# Patient Record
Sex: Male | Born: 1985 | Race: White | Hispanic: No | Marital: Married | Smoking: Never smoker
Health system: Southern US, Community
[De-identification: ages and names within clinical notes are randomized; demographics above are authoritative.]

## PROBLEM LIST (undated history)

## (undated) DIAGNOSIS — E7800 Pure hypercholesterolemia, unspecified: Secondary | ICD-10-CM

## (undated) DIAGNOSIS — J329 Chronic sinusitis, unspecified: Secondary | ICD-10-CM

## (undated) HISTORY — PX: TYMPANOSTOMY TUBE PLACEMENT: SHX32

---

## 2015-05-28 ENCOUNTER — Ambulatory Visit (INDEPENDENT_AMBULATORY_CARE_PROVIDER_SITE_OTHER): Payer: 59

## 2015-05-28 DIAGNOSIS — R002 Palpitations: Secondary | ICD-10-CM

## 2015-07-15 ENCOUNTER — Other Ambulatory Visit: Payer: Self-pay | Admitting: Physician Assistant

## 2015-07-25 ENCOUNTER — Telehealth: Payer: Self-pay | Admitting: Cardiology

## 2015-07-25 NOTE — Telephone Encounter (Signed)
Received records from [facility] for appointment on 08/09/15 with Dr Hochrein.  Records given to N Hines (medical records) for Dr Hochrein's schedule on 08/09/15. lp °

## 2015-08-07 NOTE — Progress Notes (Deleted)
    Cardiology Office Note   Date:  08/07/2015   ID:  Bradley Cohen, DOB 1985/06/28, MRN [id number]  PCP:  Pcp Not In System  Cardiologist:   Rollene Rotunda, MD   No chief complaint on file.     History of Present Illness: Bradley Cohen is a 30 y.o. male who presents for ***    No past medical history on file.  No past surgical history on file.   No current outpatient prescriptions on file.   No current facility-administered medications for this visit.     Allergies:   Review of patient's allergies indicates not on file.    Social History:  The patient     Family History:  The patient's ***family history is not on file.    ROS:  Please see the history of present illness.   Otherwise, review of systems are positive for {NONE DEFAULTED:18576::"none"}.   All other systems are reviewed and negative.    PHYSICAL EXAM: VS:  There were no vitals taken for this visit. , BMI There is no height or weight on file to calculate BMI. GENERAL:  Well appearing HEENT:  Pupils equal round and reactive, fundi not visualized, oral mucosa unremarkable NECK:  No jugular venous distention, waveform within normal limits, carotid upstroke brisk and symmetric, no bruits, no thyromegaly LYMPHATICS:  No cervical, inguinal adenopathy LUNGS:  Clear to auscultation bilaterally BACK:  No CVA tenderness CHEST:  Unremarkable HEART:  PMI not displaced or sustained,S1 and S2 within normal limits, no S3, no S4, no clicks, no rubs, *** murmurs ABD:  Flat, positive bowel sounds normal in frequency in pitch, no bruits, no rebound, no guarding, no midline pulsatile mass, no hepatomegaly, no splenomegaly EXT:  2 plus pulses throughout, no edema, no cyanosis no clubbing SKIN:  No rashes no nodules NEURO:  Cranial nerves II through XII grossly intact, motor grossly intact throughout PSYCH:  Cognitively intact, oriented to person place and time    EKG:  EKG {ACTION; IS/IS PHX:[id number]} ordered today. The  ekg ordered today demonstrates ***   Recent Labs: No results found for requested labs within last 8760 hours.    Lipid Panel No results found for: CHOL, TRIG, HDL, CHOLHDL, VLDL, LDLCALC, LDLDIRECT    Wt Readings from Last 3 Encounters:  No data found for Wt      Other studies Reviewed: Additional studies/ records that were reviewed today include: ***. Review of the above records demonstrates:  Please see elsewhere in the note.  ***   ASSESSMENT AND PLAN:  ***   Current medicines are reviewed at length with the patient today.  The patient {ACTIONS; HAS/DOES NOT HAVE:19233} concerns regarding medicines.  The following changes have been made:  {PLAN; NO CHANGE:13088:s}  Labs/ tests ordered today include: *** No orders of the defined types were placed in this encounter.    Disposition:   FU with ***    Signed, Rollene Rotunda, MD  08/07/2015 11:10 AM    [facility]

## 2015-08-09 ENCOUNTER — Ambulatory Visit: Payer: 59 | Admitting: Cardiology

## 2020-08-13 ENCOUNTER — Encounter (HOSPITAL_BASED_OUTPATIENT_CLINIC_OR_DEPARTMENT_OTHER): Payer: Self-pay | Admitting: Obstetrics and Gynecology

## 2020-08-13 ENCOUNTER — Emergency Department (HOSPITAL_BASED_OUTPATIENT_CLINIC_OR_DEPARTMENT_OTHER)
Admission: EM | Admit: 2020-08-13 | Discharge: 2020-08-13 | Disposition: A | Discharge status: Home / Self Care | Payer: BC Managed Care – PPO | Attending: Emergency Medicine | Admitting: Emergency Medicine

## 2020-08-13 ENCOUNTER — Emergency Department (HOSPITAL_BASED_OUTPATIENT_CLINIC_OR_DEPARTMENT_OTHER): Payer: BC Managed Care – PPO

## 2020-08-13 ENCOUNTER — Other Ambulatory Visit (HOSPITAL_BASED_OUTPATIENT_CLINIC_OR_DEPARTMENT_OTHER): Payer: Self-pay

## 2020-08-13 ENCOUNTER — Other Ambulatory Visit: Payer: Self-pay

## 2020-08-13 DIAGNOSIS — I82442 Acute embolism and thrombosis of left tibial vein: Secondary | ICD-10-CM | POA: Insufficient documentation

## 2020-08-13 DIAGNOSIS — Z7901 Long term (current) use of anticoagulants: Secondary | ICD-10-CM | POA: Insufficient documentation

## 2020-08-13 DIAGNOSIS — M79662 Pain in left lower leg: Secondary | ICD-10-CM | POA: Diagnosis present

## 2020-08-13 HISTORY — DX: Chronic sinusitis, unspecified: J32.9

## 2020-08-13 HISTORY — DX: Pure hypercholesterolemia, unspecified: E78.00

## 2020-08-13 LAB — BASIC METABOLIC PANEL
Anion gap: 9 (ref 5–15)
BUN: 15 mg/dL (ref 6–20)
CO2: 29 mmol/L (ref 22–32)
Calcium: 9.9 mg/dL (ref 8.9–10.3)
Chloride: 102 mmol/L (ref 98–111)
Creatinine, Ser: 1.02 mg/dL (ref 0.61–1.24)
GFR, Estimated: >60 mL/min (ref >60)
Glucose, Bld: 105 mg/dL — ABNORMAL HIGH (ref 70–99)
Potassium: 4.6 mmol/L (ref 3.5–5.1)
Sodium: 140 mmol/L (ref 135–145)

## 2020-08-13 LAB — PROTIME-INR
INR: 0.9 (ref 0.8–1.2)
Prothrombin Time: 12.5 seconds (ref 11.4–15.2)

## 2020-08-13 LAB — CBC WITH DIFFERENTIAL/PLATELET
Abs Immature Granulocytes: 0.02 10*3/uL (ref 0.00–0.07)
Basophils Absolute: 0 10*3/uL (ref 0.0–0.1)
Basophils Relative: 0 %
Eosinophils Absolute: 0.1 10*3/uL (ref 0.0–0.5)
Eosinophils Relative: 1 %
HCT: 42.1 % (ref 39.0–52.0)
Hemoglobin: 13.5 g/dL (ref 13.0–17.0)
Immature Granulocytes: 0 %
Lymphocytes Relative: 18 %
Lymphs Abs: 1.4 10*3/uL (ref 0.7–4.0)
MCH: 29 pg (ref 26.0–34.0)
MCHC: 32.1 g/dL (ref 30.0–36.0)
MCV: 90.5 fL (ref 80.0–100.0)
Monocytes Absolute: 0.6 10*3/uL (ref 0.1–1.0)
Monocytes Relative: 8 %
Neutro Abs: 5.7 10*3/uL (ref 1.7–7.7)
Neutrophils Relative %: 73 %
Platelets: 239 10*3/uL (ref 150–400)
RBC: 4.65 MIL/uL (ref 4.22–5.81)
RDW: 14.3 % (ref 11.5–15.5)
WBC: 7.8 10*3/uL (ref 4.0–10.5)
nRBC: 0 % (ref 0.0–0.2)

## 2020-08-13 MED ORDER — RIVAROXABAN (XARELTO) VTE STARTER PACK (15 & 20 MG): ORAL_TABLET | ORAL | 0 refills | Status: AC

## 2020-08-13 NOTE — ED Provider Notes (Signed)
[facility] EMERGENCY DEPT Provider Note   CSN: [id number] Arrival date & time: 08/13/20  1120     History Chief Complaint  Patient presents with   Leg Pain    Bradley Cohen is a 35 y.o. male.  Bradley Cohen has no risk factors for DVT.  He did take a flight to [place], but this was approximately 1 hour.  No family history of thrombophilia.  The history is provided by the patient.  Leg Pain Location:  Leg Time since incident:  6 days Injury: no   Leg location:  L lower leg Pain details:    Quality:  Aching   Radiates to:  Does not radiate   Severity:  Moderate   Onset quality:  Sudden   Duration:  6 days   Timing:  Constant   Progression:  Unchanged Chronicity:  New Prior injury to area:  No Relieved by:  Nothing Worsened by:  Nothing Ineffective treatments:  Elevation and rest Associated symptoms: swelling   Associated symptoms: no back pain, no fever, no numbness and no tingling       Past Medical History:  Diagnosis Date   Hypercholesterolemia    Sinusitis     Patient Active Problem List   Diagnosis Date Noted   Palpitations 05/28/2015    Past Surgical History:  Procedure Laterality Date   TYMPANOSTOMY TUBE PLACEMENT Bilateral        No family history on file.  Social History   Tobacco Use   Smoking status: Never    Passive exposure: Never   Smokeless tobacco: Never  Vaping Use   Vaping Use: Never used  Substance Use Topics   Alcohol use: Yes    Comment: Social   Drug use: Never    Home Medications Prior to Admission medications   Medication Sig Start Date End Date Taking? Authorizing Provider  acetaminophen (TYLENOL) 500 MG tablet Take 1,000 mg by mouth every 6 (six) hours as needed.   Yes [provider]  famotidine (PEPCID) 20 MG tablet Take 20 mg by mouth at bedtime as needed. 07/02/20  Yes [provider]  finasteride (PROSCAR) 5 MG tablet Take 5 mg by mouth daily. 06/18/20  Yes [provider]  Multiple Vitamin (MULTIVITAMIN) tablet Take 1 tablet by mouth daily.   Yes [provider]  RIVAROXABAN Carlena Hurl) VTE STARTER PACK (15 & 20 MG) Follow package directions: Take one 15mg  tablet by mouth twice a day. On day 22, switch to one 20mg  tablet once a day. Take with food. 08/13/20  Yes , MD  rosuvastatin (CRESTOR) 10 MG tablet Take 10 mg by mouth daily. 07/12/20  Yes [provider]    Allergies    Patient has no allergy information on record.  Review of Systems   Review of Systems  Constitutional:  Negative for chills and fever.  HENT:  Negative for ear pain and sore throat.   Eyes:  Negative for pain and visual disturbance.  Respiratory:  Negative for cough and shortness of breath.   Cardiovascular:  Negative for chest pain and palpitations.  Gastrointestinal:  Negative for abdominal pain and vomiting.  Genitourinary:  Negative for dysuria and hematuria.  Musculoskeletal:  Negative for arthralgias and back pain.  Skin:  Negative for color change and rash.  Neurological:  Negative for seizures and syncope.  All other systems reviewed and are negative.  Physical Exam Updated Vital Signs BP (!) 139/93 (BP Location: Right Arm)   Pulse 72  Temp 98.7 F (37.1 C) (Oral)   Resp 16   SpO2 99%   Physical Exam Vitals and nursing note reviewed.  Constitutional:      Appearance: Normal appearance.  HENT:     Head: Normocephalic and atraumatic.  Eyes:     Conjunctiva/sclera: Conjunctivae normal.  Pulmonary:     Effort: Pulmonary effort is normal. No respiratory distress.  Musculoskeletal:        General: Swelling present. No deformity. Normal range of motion.     Cervical back: Normal range of motion.     Comments: Mild swelling and tenderness to palpation at the posterior aspect of the left calf.  No redness. The extremity is warm and well-perfused.   Skin:    General: Skin is warm and dry.  Neurological:     General: No focal deficit  present.     Mental Status: He is alert and oriented to person, place, and time. Mental status is at baseline.  Psychiatric:        Mood and Affect: Mood normal.    ED Results / Procedures / Treatments   Labs (all labs ordered are listed, but only abnormal results are displayed) Labs Reviewed  BASIC METABOLIC PANEL - Abnormal; Notable for the following components:      Result Value   Glucose, Bld 105 (*)    All other components within normal limits  CBC WITH DIFFERENTIAL/PLATELET  PROTIME-INR    EKG None  Radiology US Venous Img Lower Unilateral Left  Result Date: 08/13/2020 CLINICAL DATA:  35 year old male with leg swelling after recent flight EXAM: LEFT LOWER EXTREMITY VENOUS DOPPLER ULTRASOUND TECHNIQUE: Gray-scale sonography with graded compression, as well as color Doppler and duplex ultrasound were performed to evaluate the lower extremity deep venous systems from the level of the common femoral vein and including the common femoral, femoral, profunda femoral, popliteal and calf veins including the posterior tibial, peroneal and gastrocnemius veins when visible. The superficial great saphenous vein was also interrogated. Spectral Doppler was utilized to evaluate flow at rest and with distal augmentation maneuvers in the common femoral, femoral and popliteal veins. COMPARISON:  None. FINDINGS: Contralateral Common Femoral Vein: Respiratory phasicity is normal and symmetric with the symptomatic side. No evidence of thrombus. Normal compressibility. Common Femoral Vein: No evidence of thrombus. Normal compressibility, respiratory phasicity and response to augmentation. Saphenofemoral Junction: No evidence of thrombus. Normal compressibility and flow on color Doppler imaging. Profunda Femoral Vein: No evidence of thrombus. Normal compressibility and flow on color Doppler imaging. Femoral Vein: No evidence of thrombus. Normal compressibility, respiratory phasicity and response to  augmentation. Popliteal Vein: No evidence of thrombus. Normal compressibility, respiratory phasicity and response to augmentation. Calf Veins: Noncompressible posterior tibial vein and peroneal vein segments of the calf. Superficial Great Saphenous Vein: No evidence of thrombus. Normal compressibility and flow on color Doppler imaging. Other Findings:  None. IMPRESSION: Sonographic survey left lower extremity positive for calf DVT of the posterior tibial vein and peroneal vein. Negative for proximal DVT. These results were called by telephone at the time of interpretation on 08/13/2020 at 12:51 pm to Dr. Pieter Partridge, Electronically Signed   By: Gilmer Mor D.O.   On: 08/13/2020 12:52    Procedures Procedures   Medications Ordered in ED Medications - No data to display  ED Course  I have reviewed the triage vital signs and the nursing notes.  Pertinent labs & imaging results that were available during my care of the patient were reviewed by  me and considered in my medical decision making (see chart for details).    MDM Rules/Calculators/A&P                           Rodell Marrs presents with a DVT.  He will be treated with Xarelto.  He was given instructions on the treatment of the condition, and he will follow-up with his primary care doctor regarding further testing.  I also spoke with his wife.  He was also given information from up-to-date.  No indication of a PE clinically.  Renal function within normal limits. Final Clinical Impression(s) / ED Diagnoses Final diagnoses:  Acute deep vein thrombosis (DVT) of tibial vein of left lower extremity (HCC)    Rx / DC Orders ED Discharge Orders          Ordered    RIVAROXABAN (XARELTO) VTE STARTER PACK (15 & 20 MG)        08/13/20 1405             Koleen Distance, MD 08/13/20 1409

## 2020-08-13 NOTE — ED Triage Notes (Signed)
Patient reports he was recently on a flight. Patient reports left leg pain that started Thursday and his left leg is swollen and warm. Patient reports increasing pain since friday

## 2021-08-06 NOTE — Progress Notes (Unsigned)
Cardiology Office Note:   Date:  08/07/2021  NAME:  Bradley Cohen    MRN: [id number] DOB:  07/02/1985   PCP:  Lyman Bishop, DO  Cardiologist:  None  Electrophysiologist:  None   Referring MD: Lyman Bishop, DO   Chief Complaint  Patient presents with   New Patient (Initial Visit)   History of Present Illness:   Bradley Cohen is a 36 y.o. male with a hx of DVT who is being seen today for the evaluation of family history of heart disease at the request of Crissie Sickles M, DO.  He reports a very strong history of heart disease.  His father had bypass surgery.  He had several uncles also had bypass surgery.  He wishes to know what else he can do to reduce his risk of having a heart attack or stroke.  His cholesterol level 2 years ago was 112.  This was his LDL value.  This was likely acceptable.  He does not have high blood pressure.  He did have a DVT recently and is on Xarelto.  His EKG is normal.  His sister also has Ehlers-Danlos.  No vascular type.  He does need screening for this.  He has very flexible.  He is never met criteria for a diagnosis of Ehlers-Danlos.  He also reports having palpitations.  He has had skipped beats ever since he returned from [place].  He did wear a monitor 5 to 6 years ago that was unremarkable.  He continues to have symptoms.  This does not appear to be bothersome to him.  He is able to exercise.  Denies any chest pain or trouble breathing.  He reports he has been slacking a bit lately.  Normally he does a regular routine.  He is looking to get back into this.  Regardless he has no symptoms.  Cholesterol level as below.  DVT was thought to be COVID-related.  He does not smoke.  No alcohol.  No drug use.  He works as an Optometrist.  He is married with 2 children.  T chol 174, HDL 41. LDL 112, TG 116  Problem List DVT -08/2020 -L posterior tibial/peroneal vein  -covid related   Past Medical History: Past Medical History:  Diagnosis Date    Hypercholesterolemia    Sinusitis     Past Surgical History: Past Surgical History:  Procedure Laterality Date   TYMPANOSTOMY TUBE PLACEMENT Bilateral     Current Medications: Current Meds  Medication Sig   acetaminophen (TYLENOL) 500 MG tablet Take 1,000 mg by mouth every 6 (six) hours as needed.   famotidine (PEPCID) 20 MG tablet Take 20 mg by mouth at bedtime as needed.   finasteride (PROSCAR) 5 MG tablet Take 5 mg by mouth daily.   Multiple Vitamin (MULTIVITAMIN) tablet Take 1 tablet by mouth daily.   RIVAROXABAN (XARELTO) VTE STARTER PACK (15 & 20 MG) Follow package directions: Take one 80m tablet by mouth twice a day. On day 22, switch to one 273mtablet once a day. Take with food.   rosuvastatin (CRESTOR) 10 MG tablet Take 10 mg by mouth daily.     Allergies:    Patient has no known allergies.   Social History: Social History   Socioeconomic History   Marital status: Married    Spouse name: Not on file   Number of children: 2   Years of education: Not on file   Highest education level: Not on file  Occupational History  Occupation: Optometrist  Tobacco Use   Smoking status: Never    Passive exposure: Never   Smokeless tobacco: Never  Vaping Use   Vaping Use: Never used  Substance and Sexual Activity   Alcohol use: Yes    Comment: Social   Drug use: Never   Sexual activity: Yes  Other Topics Concern   Not on file  Social History Narrative   Not on file   Social Determinants of Health   Financial Resource Strain: Not on file  Food Insecurity: Not on file  Transportation Needs: Not on file  Physical Activity: Not on file  Stress: Not on file  Social Connections: Not on file     Family History: The patient's family history includes Heart disease in his brother and paternal grandfather; Heart disease (age of onset: 62) in his father; Stroke in his father.  ROS:   All other ROS reviewed and negative. Pertinent positives noted in the HPI.      EKGs/Labs/Other Studies Reviewed:   The following studies were personally reviewed by me today:  EKG:  EKG is ordered today.  The ekg ordered today demonstrates NSR 58 bpm, and was personally reviewed by me.   Recent Labs: 08/13/2020: BUN 15; Creatinine, Ser 1.02; Hemoglobin 13.5; Platelets 239; Potassium 4.6; Sodium 140   Recent Lipid Panel No results found for: "CHOL", "TRIG", "HDL", "CHOLHDL", "VLDL", "LDLCALC", "LDLDIRECT"  Physical Exam:   VS:  BP 122/84 (BP Location: Left Arm, Patient Position: Sitting, Cuff Size: Large)   Pulse (!) 58   Ht 6' 2"  (1.88 m)   Wt 237 lb (107.5 kg)   BMI 30.43 kg/m    Wt Readings from Last 3 Encounters:  08/07/21 237 lb (107.5 kg)    General: Well nourished, well developed, in no acute distress Head: Atraumatic, normal size  Eyes: PEERLA, EOMI  Neck: Supple, no JVD Endocrine: No thryomegaly Cardiac: Normal S1, S2; RRR; no murmurs, rubs, or gallops Lungs: Clear to auscultation bilaterally, no wheezing, rhonchi or rales  Abd: Soft, nontender, no hepatomegaly  Ext: No edema, pulses 2+ Musculoskeletal: No deformities, BUE and BLE strength normal and equal Skin: Warm and dry, no rashes   Neuro: Alert and oriented to person, place, time, and situation, CNII-XII grossly intact, no focal deficits  Psych: Normal mood and affect   ASSESSMENT:   Bradley Cohen is a 36 y.o. male who presents for the following: 1. Family history of heart disease   2. Palpitations   3. Mixed hyperlipidemia     PLAN:   1. Family history of heart disease -Strong family history of CAD.  Currently on Crestor.  Would recommend to continue this.  We discussed healthy lifestyle.  He should pursue regular exercise as well as improve his diet.  We will proceed with calcium scoring.  Discussed if calcium score is 0 should do this every 5 years.  He will see me back based on the results of the scan.  His sister also has Ehlers-Danlos.  She does not have a vascular type but I  believe for screening echocardiogram is warranted.  We will set him up for this.  He will see me back as needed.  2. Palpitations -Has had palpitations on and off for years.  Monitor in the past has been normal.  Appears to be stress related.  No further work-up needed.  3. Mixed hyperlipidemia -We will continue Crestor.  Strong family history is the main reason for this.      Disposition:  Return if symptoms worsen or fail to improve.  Medication Adjustments/Labs and Tests Ordered: Current medicines are reviewed at length with the patient today.  Concerns regarding medicines are outlined above.  Orders Placed This Encounter  Procedures   CT CARDIAC SCORING (SELF PAY ONLY)   EKG 12-Lead   ECHOCARDIOGRAM COMPLETE   No orders of the defined types were placed in this encounter.   Patient Instructions  Medication Instructions:  The current medical regimen is effective;  continue present plan and medications.  *If you need a refill on your cardiac medications before your next appointment, please call your pharmacy*   Testing/Procedures:  CALCIUM SCORE   Echocardiogram - Your physician has requested that you have an echocardiogram. Echocardiography is a painless test that uses sound waves to create images of your heart. It provides your doctor with information about the size and shape of your heart and how well your heart's chambers and valves are working. This procedure takes approximately one hour. There are no restrictions for this procedure.     Follow-Up: At [facility], you and your health needs are our priority.  As part of our continuing mission to provide you with exceptional heart care, we have created designated Provider Care Teams.  These Care Teams include your primary Cardiologist (physician) and Advanced Practice Providers (APPs -  Physician Assistants and Nurse Practitioners) who all work together to provide you with the care you need, when you need it.  We  recommend signing up for the patient portal called "MyChart".  Sign up information is provided on this After Visit Summary.  MyChart is used to connect with patients for Virtual Visits (Telemedicine).  Patients are able to view lab/test results, encounter notes, upcoming appointments, etc.  Non-urgent messages can be sent to your provider as well.   To learn more about what you can do with MyChart, go to [email or web address].    Your next appointment:   As needed  The format for your next appointment:   In Person  Provider:   Eleonore Chiquito, MD             Signed, Addison Naegeli. Audie Box, MD, [place]  [address], [place], [place] [address] [phone]  08/07/2021 1:14 PM

## 2021-08-07 ENCOUNTER — Ambulatory Visit (INDEPENDENT_AMBULATORY_CARE_PROVIDER_SITE_OTHER): Payer: BC Managed Care – PPO | Admitting: Cardiovascular Disease

## 2021-08-07 ENCOUNTER — Encounter: Payer: Self-pay | Admitting: Cardiovascular Disease

## 2021-08-07 VITALS — BP 122/84 | HR 58 | Ht 74.0 in | Wt 237.0 lb

## 2021-08-07 DIAGNOSIS — R002 Palpitations: Secondary | ICD-10-CM

## 2021-08-07 DIAGNOSIS — E782 Mixed hyperlipidemia: Secondary | ICD-10-CM | POA: Diagnosis not present

## 2021-08-07 DIAGNOSIS — Z8249 Family history of ischemic heart disease and other diseases of the circulatory system: Secondary | ICD-10-CM

## 2021-08-07 NOTE — Patient Instructions (Addendum)
Medication Instructions:  The current medical regimen is effective;  continue present plan and medications.  *If you need a refill on your cardiac medications before your next appointment, please call your pharmacy*   Testing/Procedures: CALCIUM SCORE   Echocardiogram - Your physician has requested that you have an echocardiogram. Echocardiography is a painless test that uses sound waves to create images of your heart. It provides your doctor with information about the size and shape of your heart and how well your heart's chambers and valves are working. This procedure takes approximately one hour. There are no restrictions for this procedure.     Follow-Up: At [facility], you and your health needs are our priority.  As part of our continuing mission to provide you with exceptional heart care, we have created designated Provider Care Teams.  These Care Teams include your primary Cardiologist (physician) and Advanced Practice Providers (APPs -  Physician Assistants and Nurse Practitioners) who all work together to provide you with the care you need, when you need it.  We recommend signing up for the patient portal called "MyChart".  Sign up information is provided on this After Visit Summary.  MyChart is used to connect with patients for Virtual Visits (Telemedicine).  Patients are able to view lab/test results, encounter notes, upcoming appointments, etc.  Non-urgent messages can be sent to your provider as well.   To learn more about what you can do with MyChart, go to [email or web address].    Your next appointment:   As needed  The format for your next appointment:   In Person  Provider:   [place] O'Neal, MD          

## 2021-08-25 ENCOUNTER — Ambulatory Visit (HOSPITAL_BASED_OUTPATIENT_CLINIC_OR_DEPARTMENT_OTHER)
Admission: RE | Admit: 2021-08-25 | Discharge: 2021-08-25 | Disposition: A | Discharge status: Home / Self Care | Payer: BC Managed Care – PPO | Source: Ambulatory Visit | Attending: Cardiovascular Disease | Admitting: Cardiovascular Disease

## 2021-08-25 ENCOUNTER — Ambulatory Visit (INDEPENDENT_AMBULATORY_CARE_PROVIDER_SITE_OTHER): Payer: BC Managed Care – PPO

## 2021-08-25 DIAGNOSIS — J90 Pleural effusion, not elsewhere classified: Secondary | ICD-10-CM | POA: Diagnosis not present

## 2021-08-25 DIAGNOSIS — I517 Cardiomegaly: Secondary | ICD-10-CM | POA: Diagnosis not present

## 2021-08-25 DIAGNOSIS — R002 Palpitations: Secondary | ICD-10-CM | POA: Insufficient documentation

## 2021-08-25 DIAGNOSIS — Z8249 Family history of ischemic heart disease and other diseases of the circulatory system: Secondary | ICD-10-CM | POA: Insufficient documentation

## 2021-08-25 DIAGNOSIS — I77810 Thoracic aortic ectasia: Secondary | ICD-10-CM | POA: Diagnosis not present

## 2021-08-25 LAB — ECHOCARDIOGRAM COMPLETE
Area-P 1/2: 2.16 cm2
S' Lateral: 3.19 cm

## 2022-08-17 IMAGING — US US EXTREM LOW VENOUS*L*
1 series · 13 of 24 positions shown · non-contrast
Comparison: None.

CLINICAL DATA: 34-year-old male with leg swelling after recent
flight



[Series 1: us venous img lower uni left (dvt) · portal-venous · 13 of 46 slices shown]
[im 1/46]
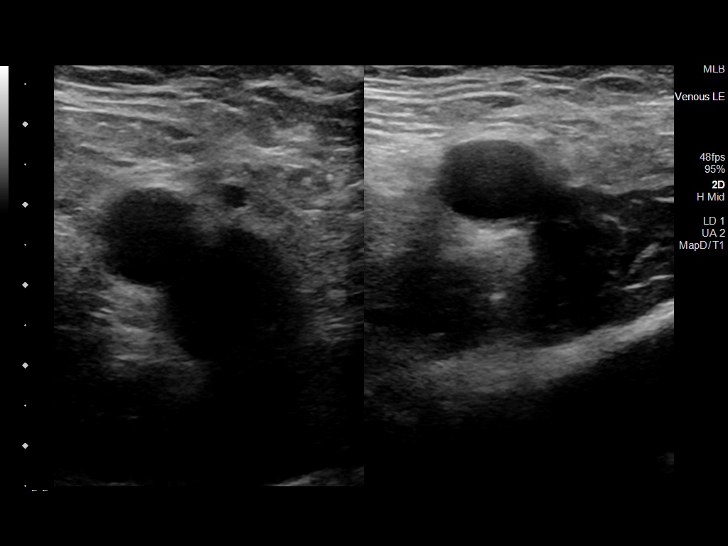
[im 4/46]
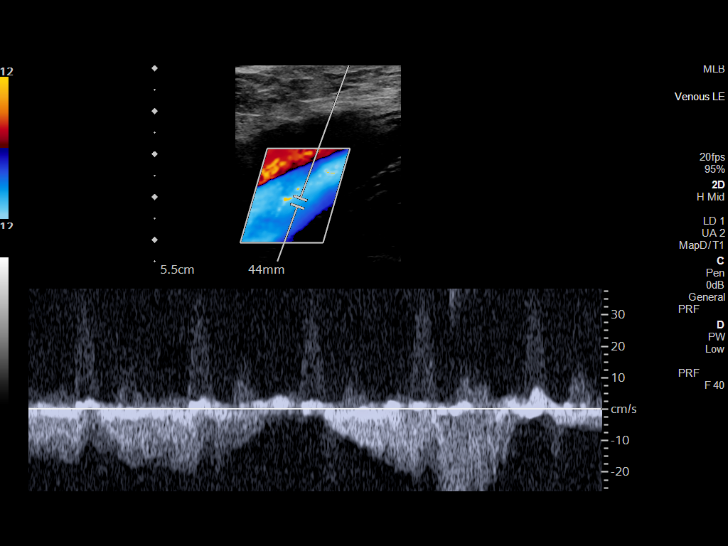
[im 8/46]
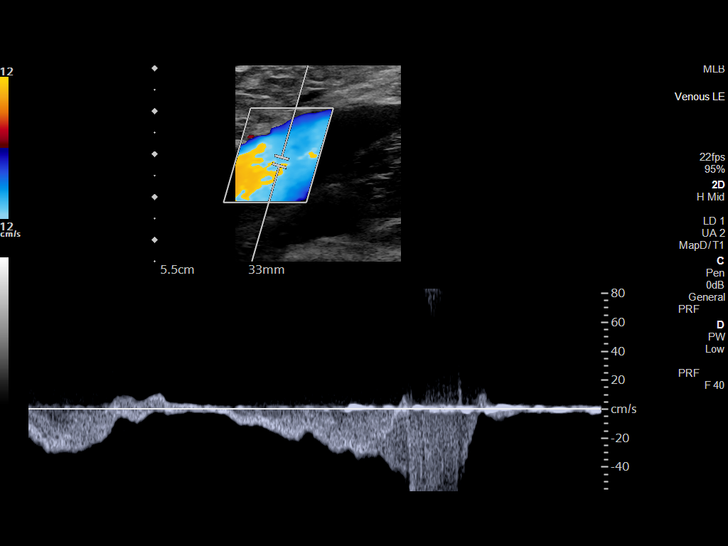
[im 12/46]
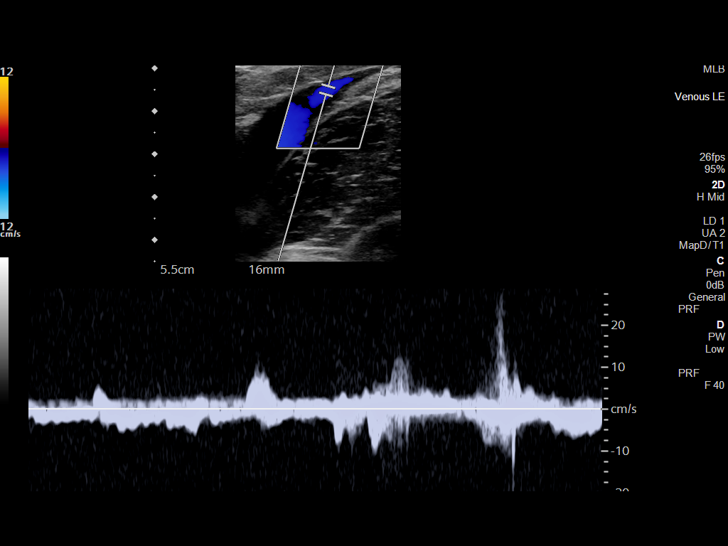
[im 16/46]
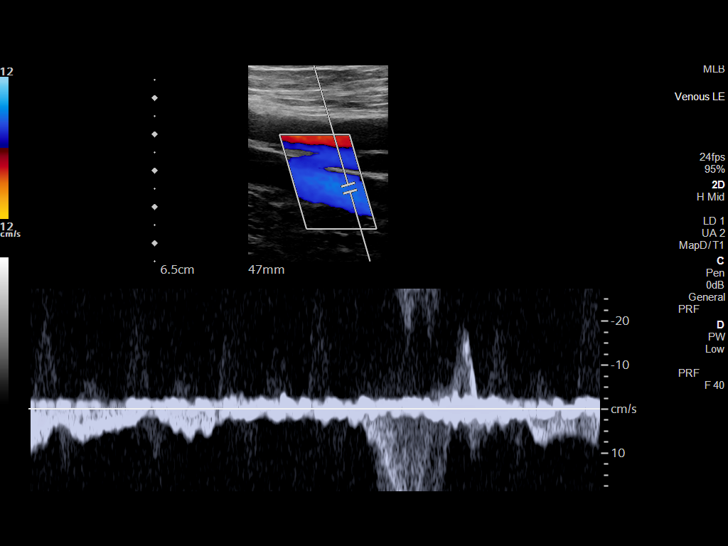
[im 20/46]
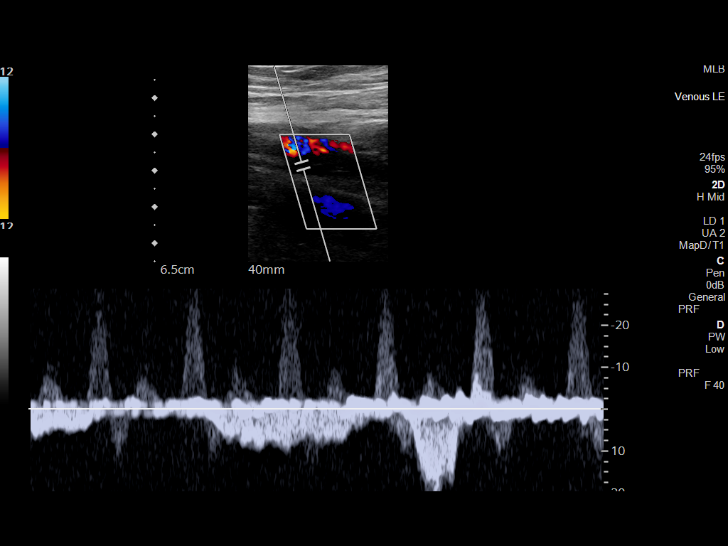
[im 24/46]
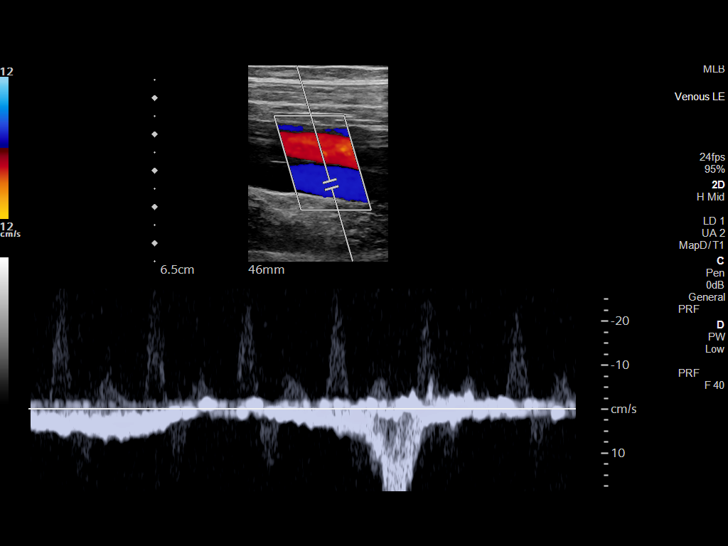
[im 26/46]
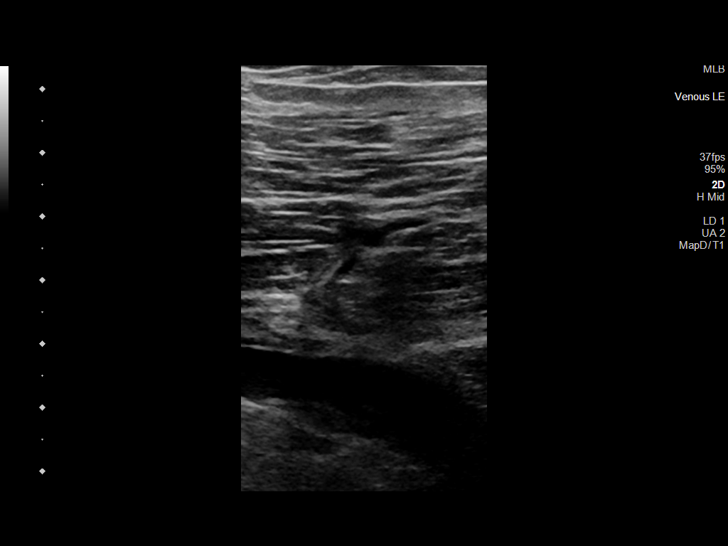
[im 30/46]
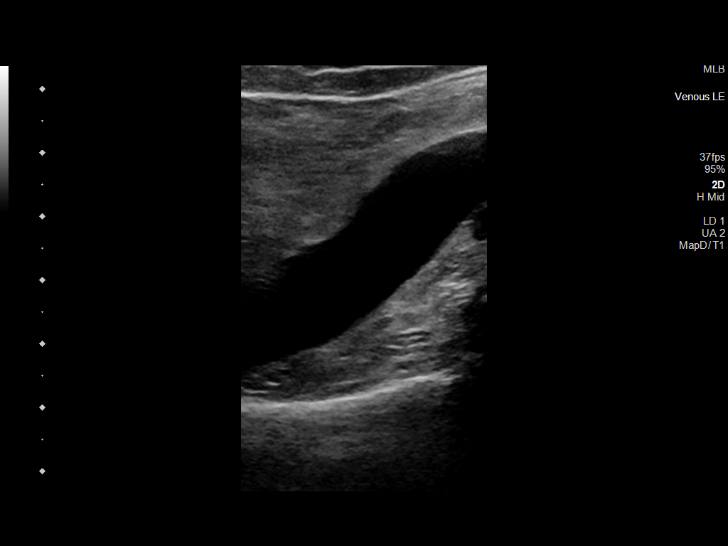
[im 34/46]
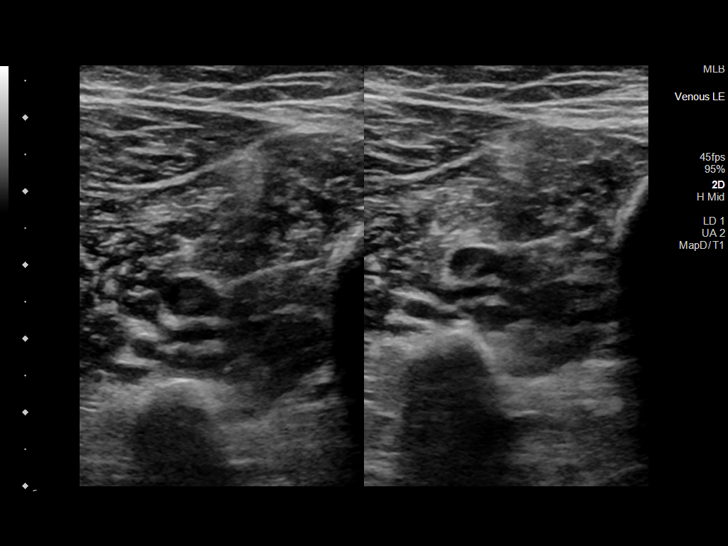
[im 38/46]
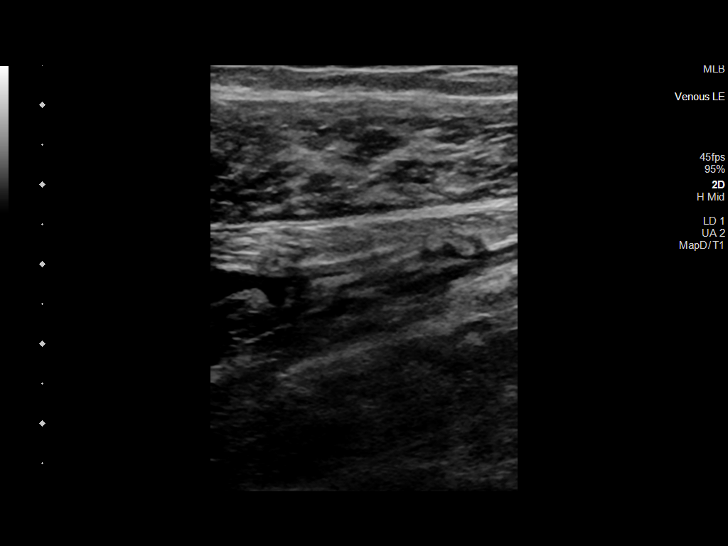
[im 42/46]
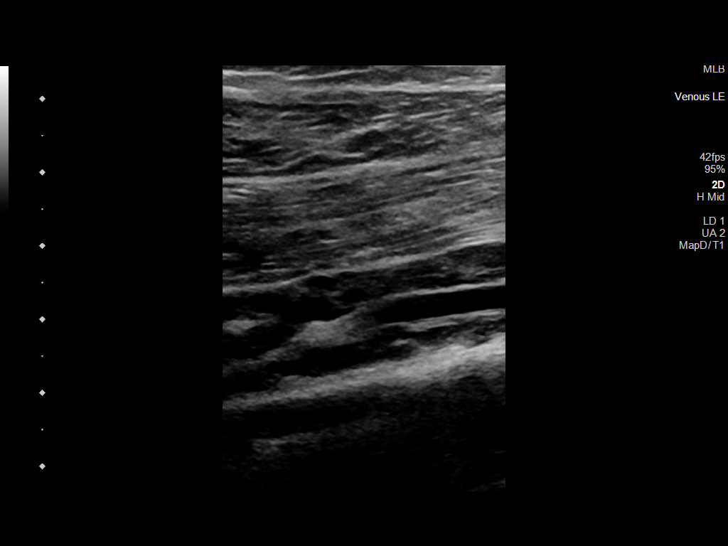
[im 46/46]
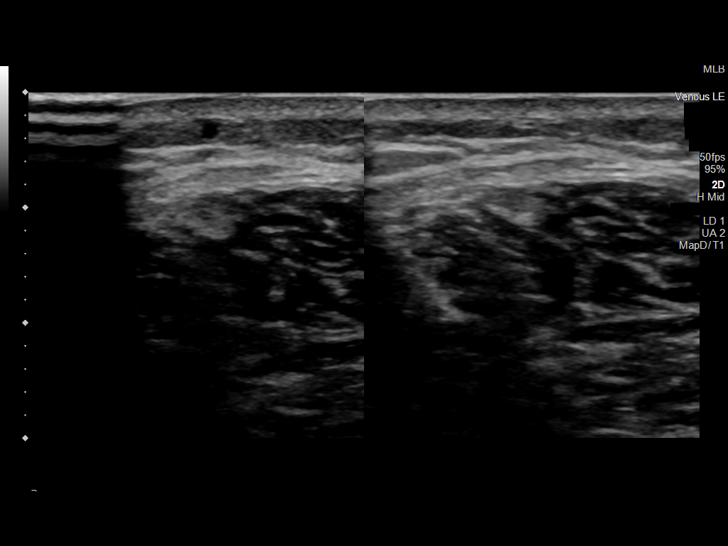

[13 of 24 positions shown; findings below may reference images not displayed]

FINDINGS: Contralateral Common Femoral Vein: Respiratory phasicity is normal
and symmetric with the symptomatic side. No evidence of thrombus.
Normal compressibility.

Common Femoral Vein: No evidence of thrombus. Normal
compressibility, respiratory phasicity and response to augmentation.

Saphenofemoral Junction: No evidence of thrombus. Normal
compressibility and flow on color Doppler imaging.

Profunda Femoral Vein: No evidence of thrombus. Normal
compressibility and flow on color Doppler imaging.

Femoral Vein: No evidence of thrombus. Normal compressibility,
respiratory phasicity and response to augmentation.

Popliteal Vein: No evidence of thrombus. Normal compressibility,
respiratory phasicity and response to augmentation.

Calf Veins: Noncompressible posterior tibial vein and peroneal vein
segments of the calf.

Superficial Great Saphenous Vein: No evidence of thrombus. Normal
compressibility and flow on color Doppler imaging.

Other Findings:  None.
IMPRESSION: Sonographic survey left lower extremity positive for calf DVT of the
posterior tibial vein and peroneal vein.

Negative for proximal DVT.

These results were called by telephone at the time of interpretation
on 08/13/2020 at [DATE] to Dr. KERSHLEY FRIQUIN,
